# Patient Record
Sex: Male | Born: 1985 | Race: White | Hispanic: No | Marital: Married | Smoking: Former smoker
Health system: Southern US, Community
[De-identification: ages and names within clinical notes are randomized; demographics above are authoritative.]

## PROBLEM LIST (undated history)

## (undated) DIAGNOSIS — K219 Gastro-esophageal reflux disease without esophagitis: Secondary | ICD-10-CM

---

## 2004-06-07 ENCOUNTER — Emergency Department: Payer: Self-pay | Admitting: Emergency Medicine

## 2005-02-05 ENCOUNTER — Emergency Department: Payer: Self-pay | Admitting: Emergency Medicine

## 2005-02-06 ENCOUNTER — Emergency Department: Payer: Self-pay | Admitting: Emergency Medicine

## 2005-07-27 ENCOUNTER — Emergency Department: Payer: Self-pay | Admitting: Internal Medicine

## 2005-09-04 ENCOUNTER — Emergency Department: Payer: Self-pay | Admitting: Emergency Medicine

## 2006-02-13 IMAGING — CR RIGHT HAND - COMPLETE 3+ VIEW
1 series · 4 of 4 positions shown · non-contrast
Comparison: none

REASON FOR EXAM: PAIN, SWELLING
COMMENTS:

[Series 1: view not recorded · 0.17mm/px · 4 of 4 slices shown]
[im 1/4]
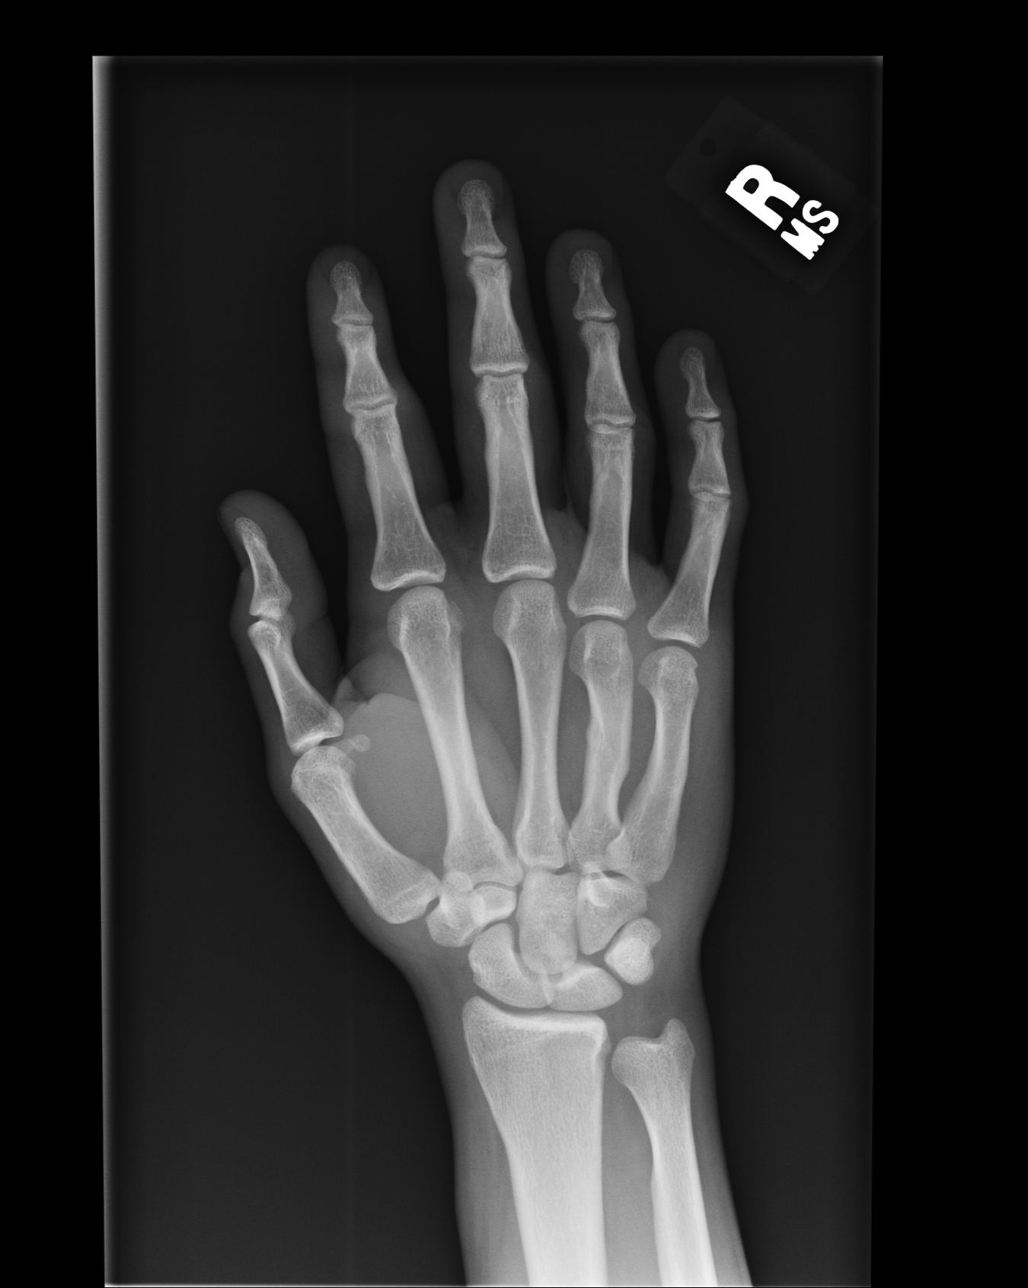
[im 2/4]
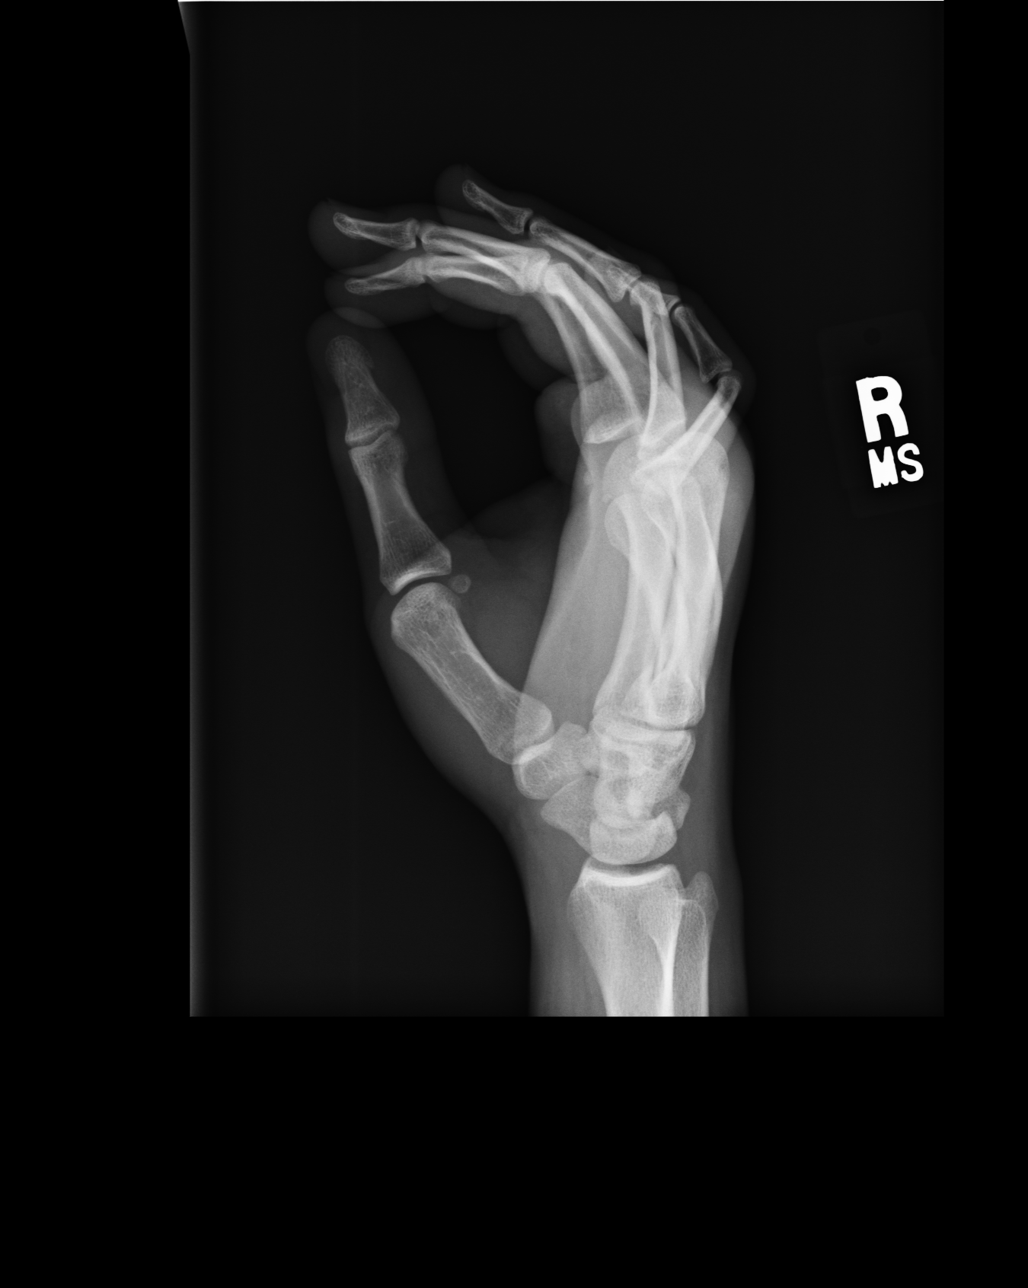
[im 3/4]
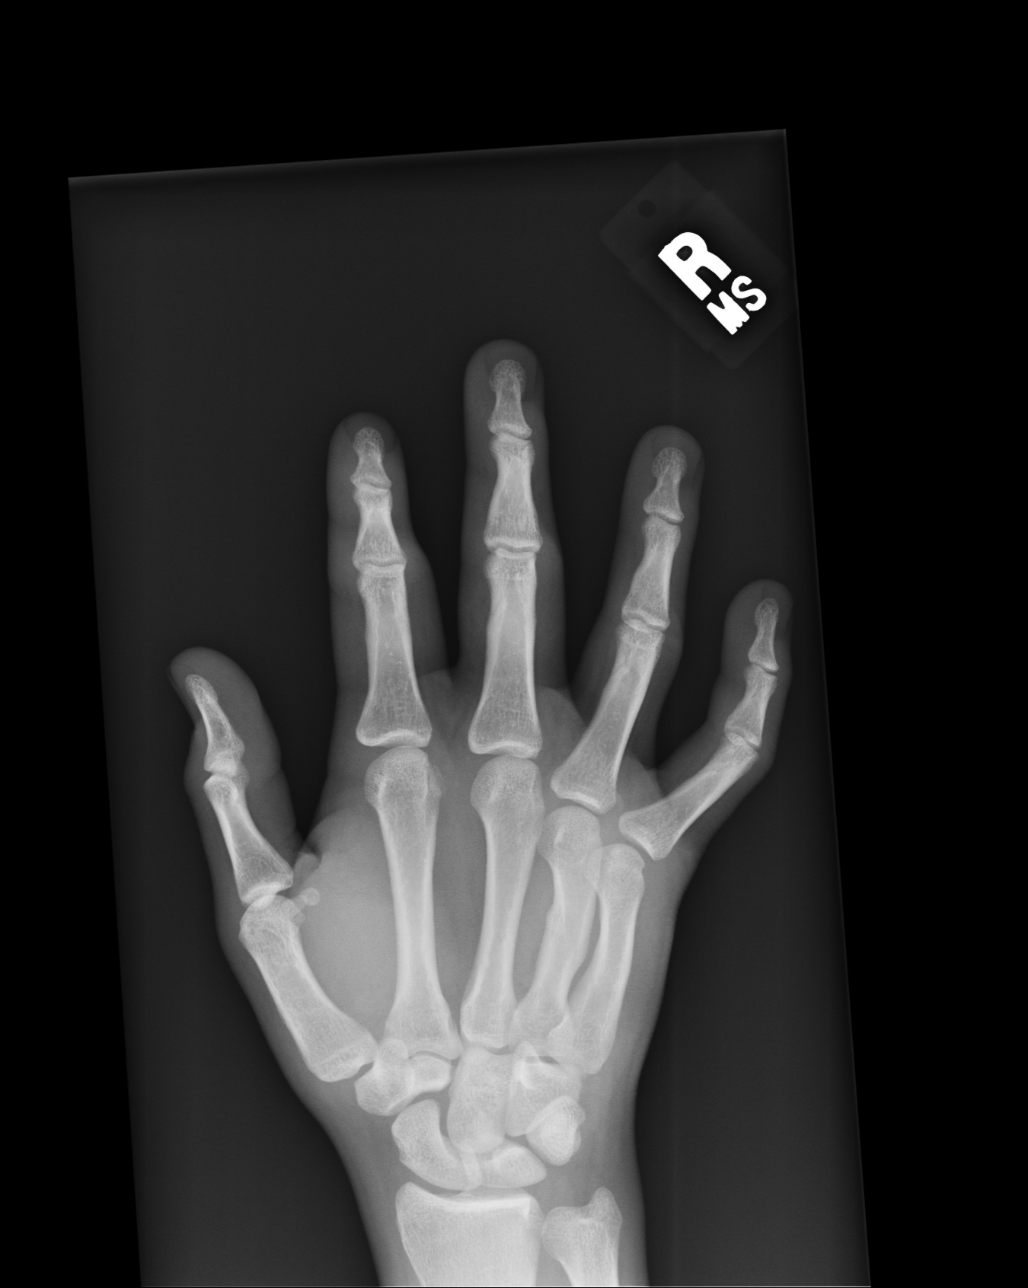
[im 4/4]
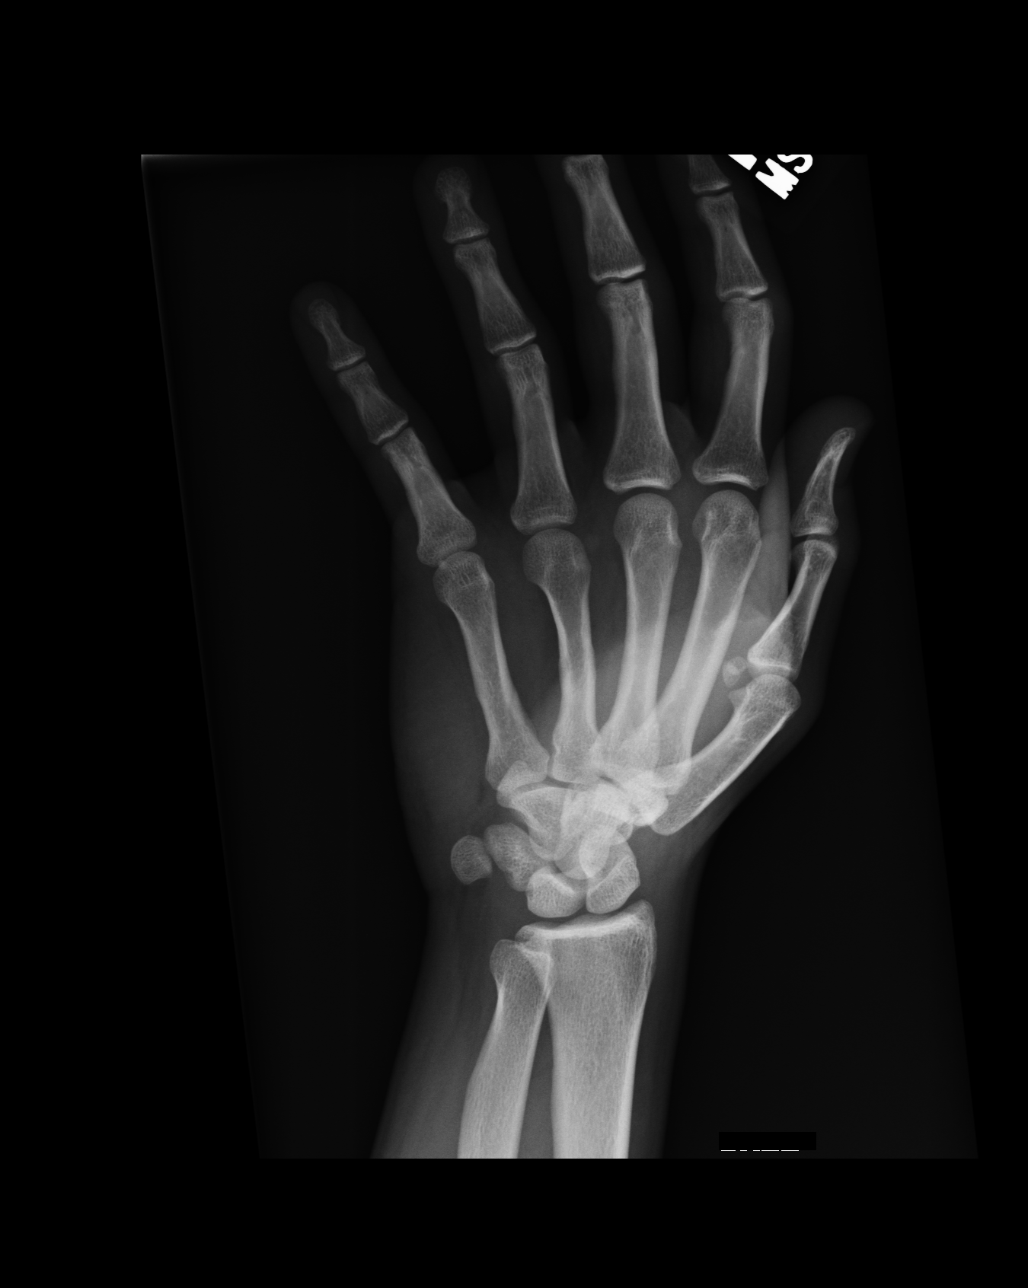

[4 of 4 positions shown; findings below may reference images not displayed]

PROCEDURE:     DXR - DXR HAND RT COMPLETE W/OBLIQUES  - September 04, 2005  [DATE]

RESULT:     Three views of the RIGHT hand show deformity of the midshaft of
the RIGHT fourth metacarpal compatible with an old fracture that has now
healed.  This is also seen on the prior exam of 07/27/05.  No acute bony
abnormalities are seen.  No acute fracture or dislocation is noted.  No
definite arthritic changes are seen.
IMPRESSION: No acute changes are identified.

No radiodense soft tissue foreign body is seen.

## 2006-12-31 ENCOUNTER — Emergency Department: Payer: Self-pay | Admitting: Emergency Medicine

## 2007-03-16 ENCOUNTER — Emergency Department: Payer: Self-pay | Admitting: Emergency Medicine

## 2007-04-06 ENCOUNTER — Emergency Department: Payer: Self-pay | Admitting: Unknown Physician Specialty

## 2007-09-05 ENCOUNTER — Emergency Department: Payer: Self-pay | Admitting: Emergency Medicine

## 2010-08-02 ENCOUNTER — Emergency Department: Payer: Self-pay | Admitting: Emergency Medicine

## 2011-02-26 ENCOUNTER — Emergency Department: Payer: Self-pay | Admitting: Emergency Medicine

## 2013-03-08 ENCOUNTER — Emergency Department: Payer: Self-pay | Admitting: Emergency Medicine

## 2013-03-08 LAB — CBC
MCH: 32.4 pg (ref 26.0–34.0)
MCHC: 34.8 g/dL (ref 32.0–36.0)
MCV: 93 fL (ref 80–100)
WBC: 6.8 10*3/uL (ref 3.8–10.6)

## 2013-03-08 LAB — URINALYSIS, COMPLETE
Bilirubin,UR: NEGATIVE
Blood: NEGATIVE
Glucose,UR: NEGATIVE mg/dL (ref 0–75)
Leukocyte Esterase: NEGATIVE
Protein: NEGATIVE
RBC,UR: <1 /HPF (ref 0–5)
Squamous Epithelial: <1
WBC UR: <1 /HPF (ref 0–5)

## 2013-03-08 LAB — COMPREHENSIVE METABOLIC PANEL
Alkaline Phosphatase: 65 U/L (ref 50–136)
Anion Gap: 1 — ABNORMAL LOW (ref 7–16)
BUN: 9 mg/dL (ref 7–18)
Bilirubin,Total: 0.8 mg/dL (ref 0.2–1.0)
Chloride: 105 mmol/L (ref 98–107)
Co2: 32 mmol/L (ref 21–32)
Creatinine: 0.95 mg/dL (ref 0.60–1.30)
EGFR (African American): >60
EGFR (Non-African Amer.): >60
Osmolality: 275 (ref 275–301)
Total Protein: 7.7 g/dL (ref 6.4–8.2)

## 2015-01-21 ENCOUNTER — Encounter: Payer: Self-pay | Admitting: Emergency Medicine

## 2015-01-21 ENCOUNTER — Emergency Department: Payer: Commercial Managed Care - HMO

## 2015-01-21 ENCOUNTER — Emergency Department
Admission: EM | Admit: 2015-01-21 | Discharge: 2015-01-21 | Disposition: A | Discharge status: Home / Self Care | Payer: Commercial Managed Care - HMO | Attending: Emergency Medicine | Admitting: Emergency Medicine

## 2015-01-21 DIAGNOSIS — W228XXA Striking against or struck by other objects, initial encounter: Secondary | ICD-10-CM | POA: Insufficient documentation

## 2015-01-21 DIAGNOSIS — S060X1A Concussion with loss of consciousness of 30 minutes or less, initial encounter: Secondary | ICD-10-CM | POA: Insufficient documentation

## 2015-01-21 DIAGNOSIS — Y998 Other external cause status: Secondary | ICD-10-CM | POA: Diagnosis not present

## 2015-01-21 DIAGNOSIS — Z720 Tobacco use: Secondary | ICD-10-CM | POA: Insufficient documentation

## 2015-01-21 DIAGNOSIS — Y9289 Other specified places as the place of occurrence of the external cause: Secondary | ICD-10-CM | POA: Insufficient documentation

## 2015-01-21 DIAGNOSIS — Y9389 Activity, other specified: Secondary | ICD-10-CM | POA: Insufficient documentation

## 2015-01-21 DIAGNOSIS — S0990XA Unspecified injury of head, initial encounter: Secondary | ICD-10-CM | POA: Diagnosis present

## 2015-01-21 NOTE — ED Notes (Signed)
States he fell this past sat   Hit head on metal piece.. Questionable loc conts to have headache

## 2015-01-21 NOTE — Discharge Instructions (Signed)
Concussion  A concussion, or closed-head injury, is a brain injury caused by a direct blow to the head or by a quick and sudden movement (jolt) of the head or neck. Concussions are usually not life-threatening. Even so, the effects of a concussion can be serious. If you have had a concussion before, you are more likely to experience concussion-like symptoms after a direct blow to the head.   CAUSES  · Direct blow to the head, such as from running into another player during a soccer game, being hit in a fight, or hitting your head on a hard surface.  · A jolt of the head or neck that causes the brain to move back and forth inside the skull, such as in a car crash.  SIGNS AND SYMPTOMS  The signs of a concussion can be hard to notice. Early on, they may be missed by you, family members, and health care providers. You may look fine but act or feel differently.  Symptoms are usually temporary, but they may last for days, weeks, or even longer. Some symptoms may appear right away while others may not show up for hours or days. Every head injury is different. Symptoms include:  · Mild to moderate headaches that will not go away.  · A feeling of pressure inside your head.  · Having more trouble than usual:  ¨ Learning or remembering things you have heard.  ¨ Answering questions.  ¨ Paying attention or concentrating.  ¨ Organizing daily tasks.  ¨ Making decisions and solving problems.  · Slowness in thinking, acting or reacting, speaking, or reading.  · Getting lost or being easily confused.  · Feeling tired all the time or lacking energy (fatigued).  · Feeling drowsy.  · Sleep disturbances.  ¨ Sleeping more than usual.  ¨ Sleeping less than usual.  ¨ Trouble falling asleep.  ¨ Trouble sleeping (insomnia).  · Loss of balance or feeling lightheaded or dizzy.  · Nausea or vomiting.  · Numbness or tingling.  · Increased sensitivity to:  ¨ Sounds.  ¨ Lights.  ¨ Distractions.  · Vision problems or eyes that tire  easily.  · Diminished sense of taste or smell.  · Ringing in the ears.  · Mood changes such as feeling sad or anxious.  · Becoming easily irritated or angry for little or no reason.  · Lack of motivation.  · Seeing or hearing things other people do not see or hear (hallucinations).  DIAGNOSIS  Your health care provider can usually diagnose a concussion based on a description of your injury and symptoms. He or she will ask whether you passed out (lost consciousness) and whether you are having trouble remembering events that happened right before and during your injury.  Your evaluation might include:  · A brain scan to look for signs of injury to the brain. Even if the test shows no injury, you may still have a concussion.  · Blood tests to be sure other problems are not present.  TREATMENT  · Concussions are usually treated in an emergency department, in urgent care, or at a clinic. You may need to stay in the hospital overnight for further treatment.  · Tell your health care provider if you are taking any medicines, including prescription medicines, over-the-counter medicines, and natural remedies. Some medicines, such as blood thinners (anticoagulants) and aspirin, may increase the chance of complications. Also tell your health care provider whether you have had alcohol or are taking illegal drugs. This information   may affect treatment.  · Your health care provider will send you home with important instructions to follow.  · How fast you will recover from a concussion depends on many factors. These factors include how severe your concussion is, what part of your brain was injured, your age, and how healthy you were before the concussion.  · Most people with mild injuries recover fully. Recovery can take time. In general, recovery is slower in older persons. Also, persons who have had a concussion in the past or have other medical problems may find that it takes longer to recover from their current injury.  HOME  CARE INSTRUCTIONS  General Instructions  · Carefully follow the directions your health care provider gave you.  · Only take over-the-counter or prescription medicines for pain, discomfort, or fever as directed by your health care provider.  · Take only those medicines that your health care provider has approved.  · Do not drink alcohol until your health care provider says you are well enough to do so. Alcohol and certain other drugs may slow your recovery and can put you at risk of further injury.  · If it is harder than usual to remember things, write them down.  · If you are easily distracted, try to do one thing at a time. For example, do not try to watch TV while fixing dinner.  · Talk with family members or close friends when making important decisions.  · Keep all follow-up appointments. Repeated evaluation of your symptoms is recommended for your recovery.  · Watch your symptoms and tell others to do the same. Complications sometimes occur after a concussion. Older adults with a brain injury may have a higher risk of serious complications, such as a blood clot on the brain.  · Tell your teachers, school nurse, school counselor, coach, athletic trainer, or work manager about your injury, symptoms, and restrictions. Tell them about what you can or cannot do. They should watch for:  ¨ Increased problems with attention or concentration.  ¨ Increased difficulty remembering or learning new information.  ¨ Increased time needed to complete tasks or assignments.  ¨ Increased irritability or decreased ability to cope with stress.  ¨ Increased symptoms.  · Rest. Rest helps the brain to heal. Make sure you:  ¨ Get plenty of sleep at night. Avoid staying up late at night.  ¨ Keep the same bedtime hours on weekends and weekdays.  ¨ Rest during the day. Take daytime naps or rest breaks when you feel tired.  · Limit activities that require a lot of thought or concentration. These include:  ¨ Doing homework or job-related  work.  ¨ Watching TV.  ¨ Working on the computer.  · Avoid any situation where there is potential for another head injury (football, hockey, soccer, basketball, martial arts, downhill snow sports and horseback riding). Your condition will get worse every time you experience a concussion. You should avoid these activities until you are evaluated by the appropriate follow-up health care providers.  Returning To Your Regular Activities  You will need to return to your normal activities slowly, not all at once. You must give your body and brain enough time for recovery.  · Do not return to sports or other athletic activities until your health care provider tells you it is safe to do so.  · Ask your health care provider when you can drive, ride a bicycle, or operate heavy machinery. Your ability to react may be slower after a   brain injury. Never do these activities if you are dizzy.  · Ask your health care provider about when you can return to work or school.  Preventing Another Concussion  It is very important to avoid another brain injury, especially before you have recovered. In rare cases, another injury can lead to permanent brain damage, brain swelling, or death. The risk of this is greatest during the first 7-10 days after a head injury. Avoid injuries by:  · Wearing a seat belt when riding in a car.  · Drinking alcohol only in moderation.  · Wearing a helmet when biking, skiing, skateboarding, skating, or doing similar activities.  · Avoiding activities that could lead to a second concussion, such as contact or recreational sports, until your health care provider says it is okay.  · Taking safety measures in your home.  ¨ Remove clutter and tripping hazards from floors and stairways.  ¨ Use grab bars in bathrooms and handrails by stairs.  ¨ Place non-slip mats on floors and in bathtubs.  ¨ Improve lighting in dim areas.  SEEK MEDICAL CARE IF:  · You have increased problems paying attention or  concentrating.  · You have increased difficulty remembering or learning new information.  · You need more time to complete tasks or assignments than before.  · You have increased irritability or decreased ability to cope with stress.  · You have more symptoms than before.  Seek medical care if you have any of the following symptoms for more than 2 weeks after your injury:  · Lasting (chronic) headaches.  · Dizziness or balance problems.  · Nausea.  · Vision problems.  · Increased sensitivity to noise or light.  · Depression or mood swings.  · Anxiety or irritability.  · Memory problems.  · Difficulty concentrating or paying attention.  · Sleep problems.  · Feeling tired all the time.  SEEK IMMEDIATE MEDICAL CARE IF:  · You have severe or worsening headaches. These may be a sign of a blood clot in the brain.  · You have weakness (even if only in one hand, leg, or part of the face).  · You have numbness.  · You have decreased coordination.  · You vomit repeatedly.  · You have increased sleepiness.  · One pupil is larger than the other.  · You have convulsions.  · You have slurred speech.  · You have increased confusion. This may be a sign of a blood clot in the brain.  · You have increased restlessness, agitation, or irritability.  · You are unable to recognize people or places.  · You have neck pain.  · It is difficult to wake you up.  · You have unusual behavior changes.  · You lose consciousness.  MAKE SURE YOU:  · Understand these instructions.  · Will watch your condition.  · Will get help right away if you are not doing well or get worse.  Document Released: 10/24/2003 Document Revised: 08/08/2013 Document Reviewed: 02/23/2013  [employer]® Patient Information ©2015 [employer]. This information is not intended to replace advice given to you by your health care provider. Make sure you discuss any questions you have with your health care provider.

## 2015-01-21 NOTE — ED Provider Notes (Signed)
[facility] Emergency Department Provider Note     Time seen: ----------------------------------------- 2:11 PM on 01/21/2015 -----------------------------------------    I have reviewed the triage vital signs and the nursing notes.   HISTORY  Chief Complaint Head Injury    HPI Omarian Jaquith is a 29 y.o. male who presents ER after head injury on Saturday. Patient states he is a concert in his head on a metal guard rail. Patient had acute loss of consciousness, continues to have headache and some dizziness with nausea. He does have persisted, headache is moderate at this time.Marland Kitchen Head injury was on the right side of his head, nothing makes his symptoms better or worse.   History reviewed. No pertinent past medical history.  There are no active problems to display for this patient.   History reviewed. No pertinent past surgical history.  No current outpatient prescriptions on file.  Allergies Review of patient's allergies indicates no known allergies.  History reviewed. No pertinent family history.  Social History History  Substance Use Topics  . Smoking status: Current Every Day Smoker  . Smokeless tobacco: Not on file  . Alcohol Use: Yes    Review of Systems Constitutional: Negative for fever. Eyes: Negative for visual changes. ENT: Negative for sore throat. Cardiovascular: Negative for chest pain. Respiratory: Negative for shortness of breath. Gastrointestinal: Negative for abdominal pain, positive for nausea Genitourinary: Negative for dysuria. Musculoskeletal: Negative for back pain. Skin: Negative for rash. Neurological: Positive for headache and dizziness  10-point ROS otherwise negative.  ____________________________________________   PHYSICAL EXAM:  VITAL SIGNS: ED Triage Vitals  Enc Vitals Group     BP 01/21/15 1148 139/97 mmHg     Pulse Rate 01/21/15 1148 110     Resp 01/21/15 1148 20     Temp 01/21/15 1148 98 F  (36.7 C)     Temp Source 01/21/15 1148 Oral     SpO2 01/21/15 1148 98 %     Weight 01/21/15 1148 190 lb (86.183 kg)     Height 01/21/15 1148  (1.727 m)     Head Cir --      Peak Flow --      Pain Score 01/21/15 1150 7     Pain Loc --      Pain Edu? --      Excl. in GC? --     Constitutional: Alert and oriented. Well appearing and in no distress. Eyes: Conjunctivae are normal. PERRL. Normal extraocular movements. ENT   Head: Small abrasion to the right frontal scalp above the eyebrow   Nose: No congestion/rhinnorhea.   Mouth/Throat: Mucous membranes are moist.   Neck: No stridor. Hematological/Lymphatic/Immunilogical: No cervical lymphadenopathy. Cardiovascular: Normal rate, regular rhythm. Normal and symmetric distal pulses are present in all extremities. No murmurs, rubs, or gallops. Respiratory: Normal respiratory effort without tachypnea nor retractions. Breath sounds are clear and equal bilaterally. No wheezes/rales/rhonchi. Gastrointestinal: Soft and nontender. No distention. No abdominal bruits. There is no CVA tenderness. Musculoskeletal: Nontender with normal range of motion in all extremities. No joint effusions.  No lower extremity tenderness nor edema. Neurologic:  Normal speech and language. No gross focal neurologic deficits are appreciated. Speech is normal. No gait instability. Skin:  Skin is warm, dry and intact. No rash noted. Psychiatric: Mood and affect are normal. Speech and behavior are normal. Patient exhibits appropriate insight and judgment.  ____________________________________________ __________________________________________  ED COURSE:  Pertinent labs & imaging results that were available during my care of the patient  were reviewed by me and considered in my medical decision making (see chart for details).  Patient has been reassured, his concussive symptoms should improve over the next several days. He is in no acute distress. No  obvious focal neurologic deficits. ____________________________________________   RADIOLOGY  IMPRESSION: 1. No acute intracranial findings. 2. Minimal chronic left maxillary sinusitis.  ____________________________________________    FINAL ASSESSMENT AND PLAN  Concussion  Plan: Patient is encouraged to have follow-up with his doctor the next 2-3 days if not improving. We'll advise rest from activity for the next 48 hours.    Emily FilbertWilliams, Ashari Llewellyn E, MD   Emily FilbertJonathan E Ajooni Karam, MD 01/21/15 [id number]

## 2016-10-06 DIAGNOSIS — J019 Acute sinusitis, unspecified: Secondary | ICD-10-CM | POA: Diagnosis not present

## 2016-11-26 ENCOUNTER — Emergency Department
Admission: EM | Admit: 2016-11-26 | Discharge: 2016-11-26 | Disposition: A | Discharge status: Home / Self Care | Payer: 59 | Attending: Emergency Medicine | Admitting: Emergency Medicine

## 2016-11-26 DIAGNOSIS — R112 Nausea with vomiting, unspecified: Secondary | ICD-10-CM

## 2016-11-26 DIAGNOSIS — R195 Other fecal abnormalities: Secondary | ICD-10-CM | POA: Diagnosis not present

## 2016-11-26 DIAGNOSIS — R110 Nausea: Secondary | ICD-10-CM | POA: Diagnosis not present

## 2016-11-26 DIAGNOSIS — F17200 Nicotine dependence, unspecified, uncomplicated: Secondary | ICD-10-CM | POA: Diagnosis not present

## 2016-11-26 DIAGNOSIS — K920 Hematemesis: Secondary | ICD-10-CM

## 2016-11-26 LAB — COMPREHENSIVE METABOLIC PANEL
ALK PHOS: 56 U/L (ref 38–126)
ALT: 43 U/L (ref 17–63)
ANION GAP: 10 (ref 5–15)
AST: 35 U/L (ref 15–41)
Albumin: 5 g/dL (ref 3.5–5.0)
BILIRUBIN TOTAL: 0.7 mg/dL (ref 0.3–1.2)
BUN: 12 mg/dL (ref 6–20)
CALCIUM: 9.6 mg/dL (ref 8.9–10.3)
CO2: 22 mmol/L (ref 22–32)
Chloride: 105 mmol/L (ref 101–111)
Creatinine, Ser: 0.9 mg/dL (ref 0.61–1.24)
GFR calc non Af Amer: >60 mL/min (ref >60)
Glucose, Bld: 91 mg/dL (ref 65–99)
POTASSIUM: 4 mmol/L (ref 3.5–5.1)
Sodium: 137 mmol/L (ref 135–145)
TOTAL PROTEIN: 8.4 g/dL — AB (ref 6.5–8.1)

## 2016-11-26 LAB — CBC
HEMATOCRIT: 45.5 % (ref 40.0–52.0)
HEMOGLOBIN: 15.7 g/dL (ref 13.0–18.0)
MCH: 32 pg (ref 26.0–34.0)
MCHC: 34.6 g/dL (ref 32.0–36.0)
MCV: 92.6 fL (ref 80.0–100.0)
Platelets: 262 10*3/uL (ref 150–440)
RBC: 4.92 MIL/uL (ref 4.40–5.90)
RDW: 12.7 % (ref 11.5–14.5)
WBC: 8.2 10*3/uL (ref 3.8–10.6)

## 2016-11-26 LAB — TYPE AND SCREEN
ABO/RH(D): O POS
ANTIBODY SCREEN: NEGATIVE

## 2016-11-26 MED ORDER — ONDANSETRON 4 MG PO TBDP: 4.0000 mg | ORAL_TABLET | Freq: Three times a day (TID) | ORAL | 0 refills | Status: DC | PRN

## 2016-11-26 NOTE — Discharge Instructions (Signed)
Please take Zofran to prevent vomiting, as persistent vomiting may lead to irritation of the esophagus and gastric lining, and bleeding.    Please call to make an appointment with the gastrointestinal doctors for follow-up.  Return to the emergency department if you develop severe pain, lightheadedness or fainting, bleeding, or any other symptoms concerning to you.

## 2016-11-26 NOTE — ED Triage Notes (Addendum)
Pt reports vomiting blood this morning and pt states this has been going on for the past 2 years.Pt reports dark red blood. Pt reports abdominal pain. Pt reports reflux.

## 2016-11-26 NOTE — ED Provider Notes (Signed)
[facility] Emergency Department Provider Note  ____________________________________________  Time seen: Approximately 12:22 PM  I have reviewed the triage vital signs and the nursing notes.   HISTORY  Chief Complaint Hematemesis    HPI Jimmy Chan is a 31 y.o. male , otherwise healthy, presenting with daily vomiting and concern for hematemesis. The patient reports that every day for the last 2 years, he stands up and becomes active, and then develops a brief nausea followed by a single episode of vomiting. He has attributed this to congestion and postnasal drip, and has not sought medical attention for this. He has not had any associated abdominal pain, diarrhea or constipation. He is an occasional marijuana user, approximately 3 times over the last year. Today, the patient vomited brown vomitus and was concerned that this could be a sign of blood. He has not noticed any melena.He does not regularly take any NSAID medications.   History reviewed. No pertinent past medical history.  There are no active problems to display for this patient.   History reviewed. No pertinent surgical history.  Current Outpatient Rx  . Order #: [id number] Class: Print    Allergies Patient has no known allergies.  No family history on file.  Social History Social History  Substance Use Topics  . Smoking status: Current Every Day Smoker  . Smokeless tobacco: Never Used  . Alcohol use Yes    Review of Systems Constitutional: No fever/chills.Lightheadedness or syncope. Eyes: No visual changes. ENT: No sore throat. No congestion or rhinorrhea. Cardiovascular: Denies chest pain. Denies palpitations. Respiratory: Denies shortness of breath.  No cough. Gastrointestinal: No abdominal pain.  Positive nausea, with daily vomiting.  Brown vomitus today concerning for hematemesis. No diarrhea.  No constipation. Genitourinary: Negative for dysuria. Musculoskeletal: Negative for  back pain. Skin: Negative for rash. Neurological: Negative for headaches. No focal numbness, tingling or weakness.   10-point ROS otherwise negative.  ____________________________________________   PHYSICAL EXAM:  VITAL SIGNS: ED Triage Vitals  Enc Vitals Group     BP 11/26/16 1112 (!) 150/89     Pulse Rate 11/26/16 1112 100     Resp 11/26/16 1112 18     Temp 11/26/16 1112 98 F (36.7 C)     Temp Source 11/26/16 1112 Oral     SpO2 11/26/16 1112 99 %     Weight 11/26/16 1114 200 lb (90.7 kg)     Height 11/26/16 1114  (1.727 m)     Head Circumference --      Peak Flow --      Pain Score 11/26/16 1115 5     Pain Loc --      Pain Edu? --      Excl. in GC? --     Constitutional: Alert and oriented. Well appearing and in no acute distress. Answers questions appropriately. Eyes: Conjunctivae are normalAnd without pallor.  EOMI. No scleral icterus. Head: Atraumatic. Nose: No congestion/rhinnorhea. Mouth/Throat: Mucous membranes are moist.  Neck: No stridor.  Supple.   Cardiovascular: Normal rate, regular rhythm. No murmurs, rubs or gallops.  Respiratory: Normal respiratory effort.  No accessory muscle use or retractions. Lungs CTAB.  No wheezes, rales or ronchi. Gastrointestinal: Soft, nontender and nondistended.  No epigastric discomfort. No guarding or rebound.  No peritoneal signs. Genitourinary: Small nonthrombosed nonbleeding external hemorrhoids and palpable nontender to internal hemorrhoids. Stool is brown and guaiac positive. Musculoskeletal: No LE edema.  Neurologic:  A&Ox3.  Speech is clear.  Face and smile are symmetric.  EOMI.  Moves all extremities well. Skin:  Skin is warm, dry and intact. No rash noted. No pallor. Psychiatric: Mood and affect are normal. Speech and behavior are normal.  Normal judgement.  ____________________________________________   LABS (all labs ordered are listed, but only abnormal results are displayed)  Labs Reviewed   COMPREHENSIVE METABOLIC PANEL - Abnormal; Notable for the following:       Result Value   Total Protein 8.4 (*)    All other components within normal limits  CBC  POC OCCULT BLOOD, ED  TYPE AND SCREEN   ____________________________________________  EKG  Not indicated ____________________________________________  RADIOLOGY  No results found.  ____________________________________________   PROCEDURES  Procedure(s) performed: None  Procedures  Critical Care performed: No ____________________________________________   INITIAL IMPRESSION / ASSESSMENT AND PLAN / ED COURSE  Pertinent labs & imaging results that were available during my care of the patient were reviewed by me and considered in my medical decision making (see chart for details).  31 y.o. male with a two-year history of single episode vomiting, concern for vomiting blood today. Overall, the patient is hemodynamically stable, and has not vomited here. He is not having any symptoms of anemia, and his blood work shows normal blood counts. He is guaiac positive from below. He does not have any epigastric tenderness, but peptic ulcer disease, gastric ulcer disease, or esophageal irritation is still possible. I will plan to have him follow up with GI as an outpatient and give him Zofran to prevent further vomiting. Return precautions as well as follow-up instructions were discussed.   ____________________________________________  FINAL CLINICAL IMPRESSION(S) / ED DIAGNOSES  Final diagnoses:  Intractable vomiting with nausea, unspecified vomiting type  Hematemesis with nausea  Heme positive stool         NEW MEDICATIONS STARTED DURING THIS VISIT:  New Prescriptions   ONDANSETRON (ZOFRAN ODT) 4 MG DISINTEGRATING TABLET    Take 1 tablet (4 mg total) by mouth every 8 (eight) hours as needed for nausea or vomiting.      Rockne Menghini, MD 11/26/16 1545

## 2017-05-06 DIAGNOSIS — T1490XA Injury, unspecified, initial encounter: Secondary | ICD-10-CM | POA: Diagnosis not present

## 2017-05-06 DIAGNOSIS — S0990XA Unspecified injury of head, initial encounter: Secondary | ICD-10-CM | POA: Diagnosis not present

## 2017-05-06 DIAGNOSIS — S299XXA Unspecified injury of thorax, initial encounter: Secondary | ICD-10-CM | POA: Diagnosis not present

## 2017-05-06 DIAGNOSIS — S42192A Fracture of other part of scapula, left shoulder, initial encounter for closed fracture: Secondary | ICD-10-CM | POA: Diagnosis not present

## 2017-05-06 DIAGNOSIS — S60511A Abrasion of right hand, initial encounter: Secondary | ICD-10-CM | POA: Diagnosis not present

## 2017-05-06 DIAGNOSIS — Z87891 Personal history of nicotine dependence: Secondary | ICD-10-CM | POA: Diagnosis not present

## 2017-05-06 DIAGNOSIS — S6992XA Unspecified injury of left wrist, hand and finger(s), initial encounter: Secondary | ICD-10-CM | POA: Diagnosis not present

## 2017-05-06 DIAGNOSIS — S60512A Abrasion of left hand, initial encounter: Secondary | ICD-10-CM | POA: Diagnosis not present

## 2017-05-06 DIAGNOSIS — S61011A Laceration without foreign body of right thumb without damage to nail, initial encounter: Secondary | ICD-10-CM | POA: Diagnosis not present

## 2017-05-06 DIAGNOSIS — S42102A Fracture of unspecified part of scapula, left shoulder, initial encounter for closed fracture: Secondary | ICD-10-CM | POA: Diagnosis not present

## 2017-05-06 DIAGNOSIS — S199XXA Unspecified injury of neck, initial encounter: Secondary | ICD-10-CM | POA: Diagnosis not present

## 2017-05-06 DIAGNOSIS — T148XXA Other injury of unspecified body region, initial encounter: Secondary | ICD-10-CM | POA: Diagnosis not present

## 2017-05-06 DIAGNOSIS — S61217A Laceration without foreign body of left little finger without damage to nail, initial encounter: Secondary | ICD-10-CM | POA: Diagnosis not present

## 2017-05-06 DIAGNOSIS — S6991XA Unspecified injury of right wrist, hand and finger(s), initial encounter: Secondary | ICD-10-CM | POA: Diagnosis not present

## 2017-05-06 DIAGNOSIS — S42112A Displaced fracture of body of scapula, left shoulder, initial encounter for closed fracture: Secondary | ICD-10-CM | POA: Diagnosis not present

## 2017-05-06 DIAGNOSIS — S3991XA Unspecified injury of abdomen, initial encounter: Secondary | ICD-10-CM | POA: Diagnosis not present

## 2017-05-06 DIAGNOSIS — Y33XXXA Other specified events, undetermined intent, initial encounter: Secondary | ICD-10-CM | POA: Diagnosis not present

## 2017-05-07 DIAGNOSIS — Y33XXXA Other specified events, undetermined intent, initial encounter: Secondary | ICD-10-CM | POA: Diagnosis not present

## 2017-05-07 DIAGNOSIS — S63207A Unspecified subluxation of left little finger, initial encounter: Secondary | ICD-10-CM | POA: Diagnosis not present

## 2017-05-07 DIAGNOSIS — S42112A Displaced fracture of body of scapula, left shoulder, initial encounter for closed fracture: Secondary | ICD-10-CM | POA: Diagnosis not present

## 2017-05-07 DIAGNOSIS — S6992XA Unspecified injury of left wrist, hand and finger(s), initial encounter: Secondary | ICD-10-CM | POA: Diagnosis not present

## 2017-05-13 DIAGNOSIS — S66307D Unspecified injury of extensor muscle, fascia and tendon of left little finger at wrist and hand level, subsequent encounter: Secondary | ICD-10-CM | POA: Diagnosis not present

## 2017-05-21 DIAGNOSIS — S61209D Unspecified open wound of unspecified finger without damage to nail, subsequent encounter: Secondary | ICD-10-CM | POA: Diagnosis not present

## 2017-05-24 DIAGNOSIS — S42112D Displaced fracture of body of scapula, left shoulder, subsequent encounter for fracture with routine healing: Secondary | ICD-10-CM | POA: Diagnosis not present

## 2017-05-24 DIAGNOSIS — S42112A Displaced fracture of body of scapula, left shoulder, initial encounter for closed fracture: Secondary | ICD-10-CM | POA: Diagnosis not present

## 2017-05-24 DIAGNOSIS — Y33XXXD Other specified events, undetermined intent, subsequent encounter: Secondary | ICD-10-CM | POA: Diagnosis not present

## 2017-05-31 DIAGNOSIS — S61209D Unspecified open wound of unspecified finger without damage to nail, subsequent encounter: Secondary | ICD-10-CM | POA: Diagnosis not present

## 2017-06-24 DIAGNOSIS — S42112D Displaced fracture of body of scapula, left shoulder, subsequent encounter for fracture with routine healing: Secondary | ICD-10-CM | POA: Diagnosis not present

## 2017-06-24 DIAGNOSIS — Y33XXXD Other specified events, undetermined intent, subsequent encounter: Secondary | ICD-10-CM | POA: Diagnosis not present

## 2017-07-02 DIAGNOSIS — S61209D Unspecified open wound of unspecified finger without damage to nail, subsequent encounter: Secondary | ICD-10-CM | POA: Diagnosis not present

## 2017-08-26 DIAGNOSIS — S42112D Displaced fracture of body of scapula, left shoulder, subsequent encounter for fracture with routine healing: Secondary | ICD-10-CM | POA: Diagnosis not present

## 2017-08-26 DIAGNOSIS — Y33XXXD Other specified events, undetermined intent, subsequent encounter: Secondary | ICD-10-CM | POA: Diagnosis not present

## 2017-11-16 ENCOUNTER — Encounter: Payer: Self-pay | Admitting: Emergency Medicine

## 2017-11-16 ENCOUNTER — Ambulatory Visit
Admission: EM | Admit: 2017-11-16 | Discharge: 2017-11-16 | Disposition: A | Discharge status: Home / Self Care | Payer: 59 | Attending: Family Medicine | Admitting: Family Medicine

## 2017-11-16 ENCOUNTER — Other Ambulatory Visit: Payer: Self-pay

## 2017-11-16 DIAGNOSIS — R05 Cough: Secondary | ICD-10-CM | POA: Diagnosis not present

## 2017-11-16 DIAGNOSIS — J019 Acute sinusitis, unspecified: Secondary | ICD-10-CM | POA: Diagnosis not present

## 2017-11-16 HISTORY — DX: Gastro-esophageal reflux disease without esophagitis: K21.9

## 2017-11-16 MED ORDER — AMOXICILLIN-POT CLAVULANATE 875-125 MG PO TABS: 1.0000 | ORAL_TABLET | Freq: Two times a day (BID) | ORAL | 0 refills | Status: AC

## 2017-11-16 NOTE — ED Triage Notes (Signed)
Patient in today c/o nasal congestion, pain and pressure x 3 days. Patient denies fever. Patient has tried OTC Sudafed.

## 2017-11-16 NOTE — ED Provider Notes (Signed)
[facility]    CSN: [id number] Arrival date & time: 11/16/17  1027  History   Chief Complaint Chief Complaint  Patient presents with  . Nasal Congestion   HPI  32 year old male presents with sinus pressure, pain, congestion patient reports that he has been sick since Saturday.  He reports severe sinus pain and pressure particularly around the eyes.  Symptoms are severe.  He reports that this feels the same as her prior bout of sinusitis.  He reports associated sore throat and cough.  He has had slight improvement in his ability to breathe through his nose with Sudafed.  No known exacerbating factors.  No fever.  No other associated symptoms.  No other complaints.  Past Medical History:  Diagnosis Date  . GERD (gastroesophageal reflux disease)    Surgical Hx - No past surgeries.  Home Medications    Prior to Admission medications   Medication Sig Start Date End Date Taking? Authorizing Provider  fluticasone (FLONASE) 50 MCG/ACT nasal spray Place 1 spray into the nose 2 (two) times daily. 10/06/16  Yes [provider]  omeprazole (PRILOSEC OTC) 20 MG tablet Take 1 tablet by mouth daily as needed.   Yes [provider]  amoxicillin-clavulanate (AUGMENTIN) 875-125 MG tablet Take 1 tablet by mouth every 12 (twelve) hours. 11/16/17   Tommie Sams, DO    Family History Family History  Problem Relation Age of Onset  . Obesity Father   . Restless legs syndrome Father     Social History Social History   Tobacco Use  . Smoking status: Former Smoker    Last attempt to quit: 11/16/2013    Years since quitting: 4.0  . Smokeless tobacco: Never Used  Substance Use Topics  . Alcohol use: Yes    Alcohol/week: 7.2 oz    Types: 12 Cans of beer per week  . Drug use: Never     Allergies   Patient has no known allergies.   Review of Systems Review of Systems  Constitutional: Negative for fever.  HENT: Positive for congestion, sinus pressure, sinus pain  and sore throat.   Respiratory: Positive for cough.    Physical Exam Triage Vital Signs ED Triage Vitals  Enc Vitals Group     BP 11/16/17 1103 (!) 141/114     Pulse Rate 11/16/17 1103 (!) 102     Resp 11/16/17 1103 16     Temp 11/16/17 1103 98 F (36.7 C)     Temp Source 11/16/17 1103 Oral     SpO2 11/16/17 1103 99 %     Weight 11/16/17 1103 200 lb (90.7 kg)     Height 11/16/17 1103 5\' 6"  (1.676 m)     Head Circumference --      Peak Flow --      Pain Score 11/16/17 1102 4     Pain Loc --      Pain Edu? --      Excl. in GC? --    Updated Vital Signs BP (!) 144/104 (BP Location: Right Arm)   Pulse (!) 102   Temp 98 F (36.7 C) (Oral)   Resp 16   Ht 5\' 6"  (1.676 m)   Wt 200 lb (90.7 kg)   SpO2 99%   BMI 32.28 kg/m   Physical Exam  Constitutional: He is oriented to person, place, and time. He appears well-developed. No distress.  HENT:  Head: Normocephalic and atraumatic.  Mouth/Throat: Oropharynx is clear and moist.  Eyes: Conjunctivae  are normal. Right eye exhibits no discharge. Left eye exhibits no discharge.  Cardiovascular: Normal rate and regular rhythm.  Pulmonary/Chest: Effort normal and breath sounds normal. He has no wheezes. He has no rales.  Neurological: He is alert and oriented to person, place, and time.  Psychiatric: He has a normal mood and affect. His behavior is normal.  Nursing note and vitals reviewed.    UC Treatments / Results  Labs (all labs ordered are listed, but only abnormal results are displayed) Labs Reviewed - No data to display  EKG None Radiology No results found.  Procedures Procedures (including critical care time)  Medications Ordered in UC Medications - No data to display   Initial Impression / Assessment and Plan / UC Course  I have reviewed the triage vital signs and the nursing notes.  Pertinent labs & imaging results that were available during my care of the patient were reviewed by me and considered in my  medical decision making (see chart for details).    32 year old male presents with sinusitis.  Treating with Augmentin.  Final Clinical Impressions(s) / UC Diagnoses   Final diagnoses:  Acute non-recurrent sinusitis, unspecified location    ED Discharge Orders        Ordered    amoxicillin-clavulanate (AUGMENTIN) 875-125 MG tablet  Every 12 hours     11/16/17 1150     Controlled Substance Prescriptions [place] Controlled Substance Registry consulted? Not Applicable   Tommie SamsCook, Franki Stemen G, DO 11/16/17 1217

## 2017-11-16 NOTE — Discharge Instructions (Signed)
Continue the flonase.  Antibiotic as prescribed.  Take care  Dr. Adriana Simasook

## 2018-04-05 DIAGNOSIS — M79645 Pain in left finger(s): Secondary | ICD-10-CM | POA: Diagnosis not present

## 2020-10-15 DEATH — deceased
